# Patient Record
Sex: Female | Born: 1980 | Hispanic: Yes | Marital: Married
Health system: Southern US, Community
[De-identification: ages and names within clinical notes are randomized; demographics above are authoritative.]

---

## 2014-08-24 ENCOUNTER — Emergency Department (HOSPITAL_COMMUNITY): Payer: No Typology Code available for payment source

## 2014-08-24 ENCOUNTER — Emergency Department (HOSPITAL_COMMUNITY)
Admission: EM | Admit: 2014-08-24 | Discharge: 2014-08-24 | Disposition: A | Discharge status: Home / Self Care | Payer: No Typology Code available for payment source | Attending: Emergency Medicine | Admitting: Emergency Medicine

## 2014-08-24 DIAGNOSIS — Y9389 Activity, other specified: Secondary | ICD-10-CM | POA: Diagnosis not present

## 2014-08-24 DIAGNOSIS — Y92410 Unspecified street and highway as the place of occurrence of the external cause: Secondary | ICD-10-CM | POA: Insufficient documentation

## 2014-08-24 DIAGNOSIS — Y998 Other external cause status: Secondary | ICD-10-CM | POA: Diagnosis not present

## 2014-08-24 DIAGNOSIS — S299XXA Unspecified injury of thorax, initial encounter: Secondary | ICD-10-CM | POA: Insufficient documentation

## 2014-08-24 DIAGNOSIS — R0789 Other chest pain: Secondary | ICD-10-CM

## 2014-08-24 DIAGNOSIS — R51 Headache: Secondary | ICD-10-CM

## 2014-08-24 DIAGNOSIS — R519 Headache, unspecified: Secondary | ICD-10-CM

## 2014-08-24 MED ORDER — IBUPROFEN 800 MG PO TABS
800.0000 mg | ORAL_TABLET | Freq: Once | ORAL | Status: AC
  Administered 2014-08-24: 800 mg via ORAL
  Filled 2014-08-24: qty 1
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
## 2014-08-24 NOTE — Discharge Instructions (Signed)
Return to the emergency room with worsening of symptoms, new symptoms or with symptoms that are concerning, especially severe worsening of headache, visual or speech changes, weakness in face, arms or legs, lightheadedness. Ibuprofen 400mg  (2 tablets 200mg ) every 5-6 hours for 3-5 days and then as needed for pain. Follow up with PCP if symptoms worsen or are persistent. Please call the number below under ED resources to establish care with a provider and follow up.    Emergency Department Resource Guide 1) Find a Doctor and Pay Out of Pocket Although you won't have to find out who is covered by your insurance plan, it is a good idea to ask around and get recommendations. You will then need to call the office and see if the doctor you have chosen will accept you as a new patient and what types of options they offer for patients who are self-pay. Some doctors offer discounts or will set up payment plans for their patients who do not have insurance, but you will need to ask so you aren't surprised when you get to your appointment.  2) Contact Your Local Health Department Not all health departments have doctors that can see patients for sick visits, but many do, so it is worth a call to see if yours does. If you don't know where your local health department is, you can check in your phone book. The CDC also has a tool to help you locate your state's health department, and many state websites also have listings of all of their local health departments.  3) Find a Walk-in Clinic If your illness is not likely to be very severe or complicated, you may want to try a walk in clinic. These are popping up all over the country in pharmacies, drugstores, and shopping centers. They're usually staffed by nurse practitioners or physician assistants that have been trained to treat common illnesses and complaints. They're usually fairly quick and inexpensive. However, if you have serious medical issues or chronic medical  problems, these are probably not your best option.  No Primary Care Doctor: - Call Health Connect at  [phone] - they can help you locate a primary care doctor that  accepts your insurance, provides certain services, etc. - Physician Referral Service- [phone]  Chronic Pain Problems: Organization         Address  Phone   Notes  [facility]  [phone] Patients need to be referred by their primary care doctor.   Medication Assistance: Organization         Address  Phone   Notes  [facility] [place] Medication [facility] [address] [place], [place] [address] [phone] --Must be a resident of [place] -- Must have NO insurance coverage whatsoever (no Medicaid/ Medicare, etc.) -- The pt. MUST have a primary care doctor that directs their care regularly and follows them in the community   MedAssist  [phone]   [employer]  [phone]    Agencies that provide inexpensive medical care: Organization         Address  Phone   Notes  [facility]  [phone]) [phone]   [facility]    [phone]) [phone]   [facility] [address] [place], [place] [address] [phone]) [phone]   [facility] [place] [address], [place] [phone]) [phone]   [facility]    [phone]   [facility]    [phone]   [place], [place] Phone:  [phone], Fax:  [phone] Hours of Operation:  9 am - 6 pm, M-F.  Also accepts Medicaid/Medicare and self-pay.  [facility] for [place], [address], [place] Phone: [phone], Fax: [phone]. Hours of Operation:  8:30 am - 5:30 pm, M-F.  Also accepts Medicaid and self-pay.  [facility] [place] [address] [place] Phone: [phone]   [place], [place], [place] [phone], Ext. [phone]  Mondays & Thursdays: 7-9 AM.  First 15 patients are seen on a first come, first serve basis.    [place] Providers:  Organization         Address  Phone   Notes  [facility] [address], [place] [phone] Also accepts self-pay patients.  [facility] [address], [place] Negron Torres  [phone]   [place], [address], [place] [phone]   [facility] [address] [place] [phone]   Lucianne Lei [address], [place]   [phone] Only accepts [place] Access [place] patients after they have their name applied to their card.   Self-Pay (no insurance) in [facility]:  Organization         Address  Phone   Notes  Sickle Cell Patients, [facility] [place] [phone]   [facility] [place] [phone]   [facility] [place]  [place], [place], [place] [phone]   [facility]/Dr. Osei-Bonsu  [address] [place] or [address], [place] [phone] Phone number for both [place] and [place] locations is the same.  Urgent Medical and [facility] [address] [place] [phone]   [facility] [address] [place] or [address] [phone]   [facility] [address] [place] [phone], phone; [phone], fax Sees patients 1st and 3rd Saturday of every month.  Must not qualify for public or private insurance (i.e. Medicaid, Medicare, [place] Health Choice, Veterans' Benefits)  Household income should be no more than 200% of the poverty level The clinic cannot treat you if you are pregnant or think you are pregnant  Sexually transmitted diseases are not treated at  the clinic.    Dental Care: Organization         Address  Phone  Notes  [facility] [place] [phone] Accepts children up to age 55 who are enrolled in [place] or [place]; pregnant women with a Medicaid card; and children who have applied for Medicaid or [place] Health Choice, but were declined, whose parents can pay a reduced fee at time of service.  [facility] Department of [facility]  [address], [place] [phone] Accepts children up to age 74 who are enrolled in [place] or [place]; pregnant women with a Medicaid card; and children who have applied for Medicaid or [place] Health Choice, but were declined, whose parents can pay a reduced fee at time of service.  [place]  Access PROGRAM  [place] [phone] Patients are seen by appointment only. Walk-ins are not accepted. [place] will see patients 18 years of age and older. Monday - Tuesday (8am-5pm) Most Wednesdays (8:30-5pm) $30 per visit, cash only  [facility] Adult Dental Access PROGRAM  [address], [facility] [phone] Patients are seen by appointment only. Walk-ins are not accepted. [place] will see patients 66 years of age and older. One Wednesday Evening (Monthly: Volunteer Based).  $30 per visit, cash only  [place]  [phone] for adults; Children under age 79, call Graduate Pediatric Dentistry at [phone]. Children aged 46-14, please call [phone] to request a pediatric application.  Dental services are provided in all areas of dental care including fillings, crowns and bridges, complete and partial dentures, implants, gum treatment, root canals, and extractions. Preventive care is also provided. Treatment is provided to both adults and children. Patients are selected via a lottery and there is often a  waiting list.   [facility] [address] [place]  [phone] [email or web address]   [facility] [address] [place], [place] [phone], Ext. [phone] Second and Fourth Thursday of each month, opens at 6:30 AM; Clinic ends at 9 AM.  Patients are seen on a first-come first-served basis, and a limited number are seen during each clinic.   [facility]  [address] Hillard Danker [place], [place] [phone]   Eligibility Requirements You must have lived in [place], [place], or [place] counties for at least the last three months.   You cannot be eligible for state or federal sponsored [employer], including [employer], [place], or [employer].   You generally cannot be eligible for healthcare insurance through your employer.    How to apply: Eligibility screenings are held every Tuesday and Wednesday afternoon from 1:00 pm until 4:00 pm. You do not need an appointment for the interview!  [facility] [address] [place], [place]   [place]  [place] Department  [place]  [phone]    Behavioral Health Resources in the Community: Intensive Outpatient Programs Organization         Address  Phone  Notes  [place]. [address], [place], [place] [phone]   [facility] [address] [place], [place]   ADS: Alcohol & Drug Svcs [address], [place], [place]   [place] [address]  [place], [place] or [phone]   Substance Abuse Resources Organization         Address  Phone  Notes  Alcohol and Drug Services  [phone]   [place]  [phone]   [facility] [place]   Chinita Pester  [phone]   Residential & Outpatient Substance Abuse  Program  [phone]   Psychological Services Organization         Address  Phone  Notes  [facility] [place]  [place]  [phone]   [place] [address] [place] or [phone]    Mobile Crisis Teams Organization         Address  Phone  Notes  [facility], Mobile Crisis Care Unit  [phone]   [facility]  [address] [place],  [place] [phone]   [facility] De[facility]sch [address] [place] [phone]    Self-Help/Support Groups Organization         Address  Phone             Notes  [facility]oc[facility] of [place] - variety of support groups  336- [phone] Call for more information  Narcotics Anonymous (NA), [facility]aring [facility] [address], [place]  2 meetings at this location   Statisticianesidential Treatment Programs Organization         Address  Phone  Notes  [facility] [address],    [place]  [phone]   [facility]  [address], [place] [phone], [place], [place] [phone]   [facility] [address], [place] [phone] Admissions: 8am-3pm M-F  [facility] [address]    [place], [place] [phone]   [facility] [address], [place], [place] [phone]   [facility] [address]  [place], [place] [phone]   [facility] - Intensive Outpatient [address], [place], [place] [phone]   [facility] ([facility] [address]  [place], [place] [phone] or [phone]   [facility] Services (RTS) [address] [place], [place] [phone] Accepts Medicaid  [facility] [address]  [place] [phone] Substance Abuse/Addiction Treatment   [facility] Behavioral Health Resources Organization          Address  Phone  Notes  [facility]  [phone]   Angie FavaJulie Brannon, PhD [address], [address] [place], [place]   [phone](336) [phone] or [phone](336) [phone]   [facility]   [address] [place], [place] [phone](336) [phone]   [facility] [address], [place], [place] [phone](336) [phone] Insurance/Medicaid/sponsorship through [facility]  Faith and Families [address]                                    [place], [place] [phone](336) [phone] Therapy/tele-psych/case  [facility] [address]   [place], [place] [phone](336) [phone]    Dr. Lolly MustacheArfeen  [phone](336) [phone]   [facility]  United Way [facility] 1) [address], [place] 2) [address], [place] 3)  [address], [place] [phone](336) [phone]) [phone]  [phone]   [facility] Child Abuse Hotline [phone](336) [phone] or [phone](336) [phone] (After Hours)
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
